# Patient Record
Sex: Female | Born: 2000 | Race: White | Hispanic: No | Marital: Single
Health system: Southern US, Community
[De-identification: ages and names within clinical notes are randomized; demographics above are authoritative.]

---
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
## 2018-02-08 ENCOUNTER — Emergency Department
Admission: EM | Admit: 2018-02-08 | Discharge: 2018-02-08 | Disposition: A | Discharge status: Home / Self Care | Payer: Managed Care, Other (non HMO) | Attending: Emergency Medicine | Admitting: Emergency Medicine

## 2018-02-08 ENCOUNTER — Other Ambulatory Visit: Payer: Self-pay

## 2018-02-08 ENCOUNTER — Encounter: Payer: Self-pay | Admitting: Emergency Medicine

## 2018-02-08 ENCOUNTER — Emergency Department: Payer: Managed Care, Other (non HMO)

## 2018-02-08 DIAGNOSIS — J181 Lobar pneumonia, unspecified organism: Secondary | ICD-10-CM | POA: Insufficient documentation

## 2018-02-08 DIAGNOSIS — R509 Fever, unspecified: Secondary | ICD-10-CM | POA: Diagnosis present

## 2018-02-08 DIAGNOSIS — J189 Pneumonia, unspecified organism: Secondary | ICD-10-CM

## 2018-02-08 LAB — CBC WITH DIFFERENTIAL/PLATELET
Abs Immature Granulocytes: 0.03 10*3/uL (ref 0.00–0.07)
BASOS ABS: 0 10*3/uL (ref 0.0–0.1)
BASOS PCT: 0 %
EOS ABS: 0 10*3/uL (ref 0.0–0.5)
EOS PCT: 0 %
HCT: 34.6 % — ABNORMAL LOW (ref 36.0–46.0)
Hemoglobin: 11.1 g/dL — ABNORMAL LOW (ref 12.0–15.0)
IMMATURE GRANULOCYTES: 1 %
Lymphocytes Relative: 11 %
Lymphs Abs: 0.6 10*3/uL — ABNORMAL LOW (ref 0.7–4.0)
MCH: 25.5 pg — ABNORMAL LOW (ref 26.0–34.0)
MCHC: 32.1 g/dL (ref 30.0–36.0)
MCV: 79.5 fL — ABNORMAL LOW (ref 80.0–100.0)
Monocytes Absolute: 0.3 10*3/uL (ref 0.1–1.0)
Monocytes Relative: 5 %
NEUTROS PCT: 83 %
NRBC: 0 % (ref 0.0–0.2)
Neutro Abs: 5.1 10*3/uL (ref 1.7–7.7)
PLATELETS: 205 10*3/uL (ref 150–400)
RBC: 4.35 MIL/uL (ref 3.87–5.11)
RDW: 13.9 % (ref 11.5–15.5)
WBC: 6.1 10*3/uL (ref 4.0–10.5)

## 2018-02-08 LAB — COMPREHENSIVE METABOLIC PANEL
ALBUMIN: 3.1 g/dL — AB (ref 3.5–5.0)
ALT: 8 U/L (ref 0–44)
ANION GAP: 11 (ref 5–15)
AST: 23 U/L (ref 15–41)
Alkaline Phosphatase: 99 U/L (ref 38–126)
BUN: 7 mg/dL (ref 6–20)
CALCIUM: 8.4 mg/dL — AB (ref 8.9–10.3)
CHLORIDE: 105 mmol/L (ref 98–111)
CO2: 20 mmol/L — ABNORMAL LOW (ref 22–32)
CREATININE: 0.65 mg/dL (ref 0.44–1.00)
Glucose, Bld: 96 mg/dL (ref 70–99)
Potassium: 4 mmol/L (ref 3.5–5.1)
Sodium: 136 mmol/L (ref 135–145)
Total Bilirubin: 1.1 mg/dL (ref 0.3–1.2)
Total Protein: 7.4 g/dL (ref 6.5–8.1)

## 2018-02-08 LAB — HCG, QUANTITATIVE, PREGNANCY

## 2018-02-08 MED ORDER — SODIUM CHLORIDE 0.9 % IV BOLUS
1000.0000 mL | Freq: Once | INTRAVENOUS | Status: AC
  Administered 2018-02-08: 1000 mL via INTRAVENOUS

## 2018-02-08 MED ORDER — ONDANSETRON 4 MG PO TBDP: 4.0000 mg | ORAL_TABLET | Freq: Three times a day (TID) | ORAL | 0 refills | Status: AC | PRN

## 2018-02-08 MED ORDER — ONDANSETRON HCL 4 MG/2ML IJ SOLN
4.0000 mg | Freq: Once | INTRAMUSCULAR | Status: AC
  Administered 2018-02-08: 4 mg via INTRAVENOUS

## 2018-02-08 MED ORDER — ACETAMINOPHEN 500 MG PO TABS
1000.0000 mg | ORAL_TABLET | Freq: Once | ORAL | Status: AC
  Administered 2018-02-08: 1000 mg via ORAL
  Filled 2018-02-08: qty 2

## 2018-02-08 MED ORDER — LEVOFLOXACIN 750 MG PO TABS: 750.0000 mg | ORAL_TABLET | Freq: Every day | ORAL | 0 refills | Status: AC

## 2018-02-08 MED ORDER — IBUPROFEN 600 MG PO TABS
600.0000 mg | ORAL_TABLET | Freq: Once | ORAL | Status: AC
  Administered 2018-02-08: 600 mg via ORAL
  Filled 2018-02-08: qty 1

## 2018-02-08 MED ORDER — LEVOFLOXACIN IN D5W 750 MG/150ML IV SOLN
750.0000 mg | Freq: Once | INTRAVENOUS | Status: AC
  Administered 2018-02-08: 750 mg via INTRAVENOUS
  Filled 2018-02-08: qty 150

## 2018-02-08 MED ORDER — ONDANSETRON HCL 4 MG/2ML IJ SOLN
INTRAMUSCULAR | Status: AC
  Administered 2018-02-08: 4 mg via INTRAVENOUS
  Filled 2018-02-08: qty 4

## 2018-02-08 NOTE — Discharge Instructions (Addendum)
Please stop taking your doxycycline and switch to Levaquin instead.  Take Levaquin once a day for the next 6 days to complete a 7-day course.  Return to the emergency department sooner for any concerns.  It was a pleasure to take care of you today, and thank you for coming to our emergency department.  If you have any questions or concerns before leaving please ask the nurse to grab me and I'm more than happy to go through your aftercare instructions again.  If you were prescribed any opioid pain medication today such as Norco, Vicodin, Percocet, morphine, hydrocodone, or oxycodone please make sure you do not drive when you are taking this medication as it can alter your ability to drive safely.  If you have any concerns once you are home that you are not improving or are in fact getting worse before you can make it to your follow-up appointment, please do not hesitate to call [phone] and come back for further evaluation.  Merrily BrittleNeil Michalene Debruler, MD  Results for orders placed or performed during the hospital encounter of 02/08/18  Comprehensive metabolic panel  Result Value Ref Range   Sodium 136 135 - 145 mmol/L   Potassium 4.0 3.5 - 5.1 mmol/L   Chloride 105 98 - 111 mmol/L   CO2 20 (L) 22 - 32 mmol/L   Glucose, Bld 96 70 - 99 mg/dL   BUN 7 6 - 20 mg/dL   Creatinine, Ser 1.190.65 0.44 - 1.00 mg/dL   Calcium 8.4 (L) 8.9 - 10.3 mg/dL   Total Protein 7.4 6.5 - 8.1 g/dL   Albumin 3.1 (L) 3.5 - 5.0 g/dL   AST 23 15 - 41 U/L   ALT 8 0 - 44 U/L   Alkaline Phosphatase 99 38 - 126 U/L   Total Bilirubin 1.1 0.3 - 1.2 mg/dL   GFR calc non Af Amer >60 >60 mL/min   GFR calc Af Amer >60 >60 mL/min   Anion gap 11 5 - 15  CBC with Differential  Result Value Ref Range   WBC 6.1 4.0 - 10.5 K/uL   RBC 4.35 3.87 - 5.11 MIL/uL   Hemoglobin 11.1 (L) 12.0 - 15.0 g/dL   HCT 14.734.6 (L) 82.936.0 - 56.246.0 %   MCV 79.5 (L) 80.0 - 100.0 fL   MCH 25.5 (L) 26.0 - 34.0 pg   MCHC 32.1 30.0 - 36.0 g/dL   RDW 13.013.9 86.511.5 - 78.415.5 %   Platelets 205 150 - 400 K/uL   nRBC 0.0 0.0 - 0.2 %   Neutrophils Relative % PENDING %   Neutro Abs PENDING 1.7 - 7.7 K/uL   Band Neutrophils PENDING %   Lymphocytes Relative PENDING %   Lymphs Abs PENDING 0.7 - 4.0 K/uL   Monocytes Relative PENDING %   Monocytes Absolute PENDING 0.1 - 1.0 K/uL   Eosinophils Relative PENDING %   Eosinophils Absolute PENDING 0.0 - 0.5 K/uL   Basophils Relative PENDING %   Basophils Absolute PENDING 0.0 - 0.1 K/uL   WBC Morphology PENDING    RBC Morphology PENDING    Smear Review PENDING    Other PENDING %   nRBC PENDING 0 /100 WBC   Metamyelocytes Relative PENDING %   Myelocytes PENDING %   Promyelocytes Relative PENDING %   Blasts PENDING %   Dg Chest 2 View  Result Date: 02/08/2018 CLINICAL DATA:  Shortness of breath EXAM: CHEST - 2 VIEW COMPARISON:  None. FINDINGS: Left lower lobe pneumonia. No pleural  effusion. Normal heart size. No pneumothorax. IMPRESSION: Left lower lobe pneumonia Electronically Signed   By: Jasmine Pang M.D.   On: 02/08/2018 02:08

## 2018-02-08 NOTE — ED Provider Notes (Signed)
[facility] Emergency Department Provider Note  ____________________________________________   First MD Initiated Contact with Patient 02/08/18 0154     (approximate)  I have reviewed the triage vital signs and the nursing notes.   HISTORY  Chief Complaint Fever   HPI Rebekah Nicholson is a 18 y.o. female who comes to the emergency department with fever cough and shortness of breath for the past several days.  She said she was diagnosed with both influenza and pneumonia this past Friday and was prescribed oxacillin.  She has been taking the medications although feels her symptoms have not improved.  They are currently worse with exertion and somewhat improved with rest.  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  She does have several sick contacts.    History reviewed. No pertinent past medical history.  There are no active problems to display for this patient.   History reviewed. No pertinent surgical history.  Prior to Admission medications   Medication Sig Start Date End Date Taking? Authorizing Provider  levofloxacin (LEVAQUIN) 750 MG tablet Take 1 tablet (750 mg total) by mouth daily for 6 days. 02/08/18 02/14/18  Merrily Brittle, MD  ondansetron (ZOFRAN ODT) 4 MG disintegrating tablet Take 1 tablet (4 mg total) by mouth every 8 (eight) hours as needed for nausea or vomiting. 02/08/18   Merrily Brittle, MD    Allergies Zithromax [azithromycin]  No family history on file.  Social History Social History   Tobacco Use  . Smoking status: Not on file  Substance Use Topics  . Alcohol use: Not on file  . Drug use: Not on file    Review of Systems Constitutional: Positive for fevers and chills Eyes: No visual changes. ENT: No sore throat. Cardiovascular: Positive for chest pain. Respiratory: Positive for shortness of breath. Gastrointestinal: No abdominal pain.  No nausea, no vomiting.  No diarrhea.  No constipation. Genitourinary: Negative for  dysuria. Musculoskeletal: Negative for back pain. Skin: Negative for rash. Neurological: Negative for headaches, focal weakness or numbness.   ____________________________________________   PHYSICAL EXAM:  VITAL SIGNS: ED Triage Vitals [02/08/18 0154]  Enc Vitals Group     BP      Pulse      Resp      Temp      Temp src      SpO2      Weight 126 lb (57.2 kg)     Height 5\' 5"  (1.651 m)     Head Circumference      Peak Flow      Pain Score      Pain Loc      Pain Edu?      Excl. in GC?     Constitutional: Alert and oriented x4 nontoxic no diaphoresis speaks in full clear sentences Eyes: PERRL EOMI. Head: Atraumatic. Nose: No congestion/rhinnorhea. Mouth/Throat: No trismus Neck: No stridor.   Cardiovascular: Tachycardic rate, regular rhythm. Grossly normal heart sounds.  Good peripheral circulation. Respiratory: Increased respiratory effort.  No retractions.  Rales at left base Gastrointestinal: Soft nontender Musculoskeletal: No lower extremity edema   Neurologic:  Normal speech and language. No gross focal neurologic deficits are appreciated. Skin:  Skin is warm, dry and intact. No rash noted. Psychiatric: Mood and affect are normal. Speech and behavior are normal.    ____________________________________________   DIFFERENTIAL includes but not limited to  Pneumonia, influenza, viral syndrome, empyema ____________________________________________   LABS (all labs ordered are listed, but only abnormal results are displayed)  Labs Reviewed  COMPREHENSIVE METABOLIC PANEL - Abnormal; Notable for the following components:      Result Value   CO2 20 (*)    Calcium 8.4 (*)    Albumin 3.1 (*)    All other components within normal limits  CBC WITH DIFFERENTIAL/PLATELET - Abnormal; Notable for the following components:   Hemoglobin 11.1 (*)    HCT 34.6 (*)    MCV 79.5 (*)    MCH 25.5 (*)    Lymphs Abs 0.6 (*)    All other components within normal limits  HCG,  QUANTITATIVE, PREGNANCY    Lab work reviewed by me with slightly elevated white count which is nonspecific and could be secondary to infection.  Slightly low CO2 consistent with acidemia __________________________________________  EKG   ____________________________________________  RADIOLOGY  This x-ray reviewed by me shows left lower lobe pneumonia ____________________________________________   PROCEDURES  Procedure(s) performed: no  Procedures  Critical Care performed: no  ____________________________________________   INITIAL IMPRESSION / ASSESSMENT AND PLAN / ED COURSE  Pertinent labs & imaging results that were available during my care of the patient were reviewed by me and considered in my medical decision making (see chart for details).   As part of my medical decision making, I reviewed the following data within the electronic MEDICAL RECORD NUMBER History obtained from family if available, nursing notes, old chart and ekg, as well as notes from prior ED visits.  The patient comes to the emergency department with persistent fever cough and malaise.  She is initially somewhat tachycardic although this came down with fluids.  I obtained a chest x-ray which did confirm left lower lobe pneumonia.  She has failed doxycycline at this point which is not surprising as it does not have great strep pneumo coverage.  I would normally treat her with azithromycin and amoxicillin for strep pneumo and atypical coverage however as she is anaphylactic to azithromycin I do think she requires Levaquin.  First dose given here and 6 days for home.  Strict return precautions have been given.  Her heart rate normalized prior to discharge.      ____________________________________________   FINAL CLINICAL IMPRESSION(S) / ED DIAGNOSES  Final diagnoses:  Community acquired pneumonia of left lower lobe of lung (HCC)      NEW MEDICATIONS STARTED DURING THIS VISIT:  Discharge Medication  List as of 02/08/2018  3:16 AM    START taking these medications   Details  levofloxacin (LEVAQUIN) 750 MG tablet Take 1 tablet (750 mg total) by mouth daily for 6 days., Starting Thu 02/08/2018, Until Wed 02/14/2018, Print    ondansetron (ZOFRAN ODT) 4 MG disintegrating tablet Take 1 tablet (4 mg total) by mouth every 8 (eight) hours as needed for nausea or vomiting., Starting Thu 02/08/2018, Print         Note:  This document was prepared using Dragon voice recognition software and may include unintentional dictation errors.    Merrily Brittleifenbark, Aldric Wenzler, MD 02/11/18 [phone]

## 2018-02-08 NOTE — ED Triage Notes (Signed)
Patient ambulatory to triage with steady gait, without difficulty or distress noted; pt reports dx with influenza and pneumonia on Friday; currently taking doxycycline; c/o persistent fever with N/V and prod cough green sputum

## 2019-08-16 IMAGING — CR DG CHEST 2V
2 series · 2 of 2 positions shown · non-contrast
Comparison: None.

CLINICAL DATA: Shortness of breath

EXAM:
CHEST - 2 VIEW

[chest pa]
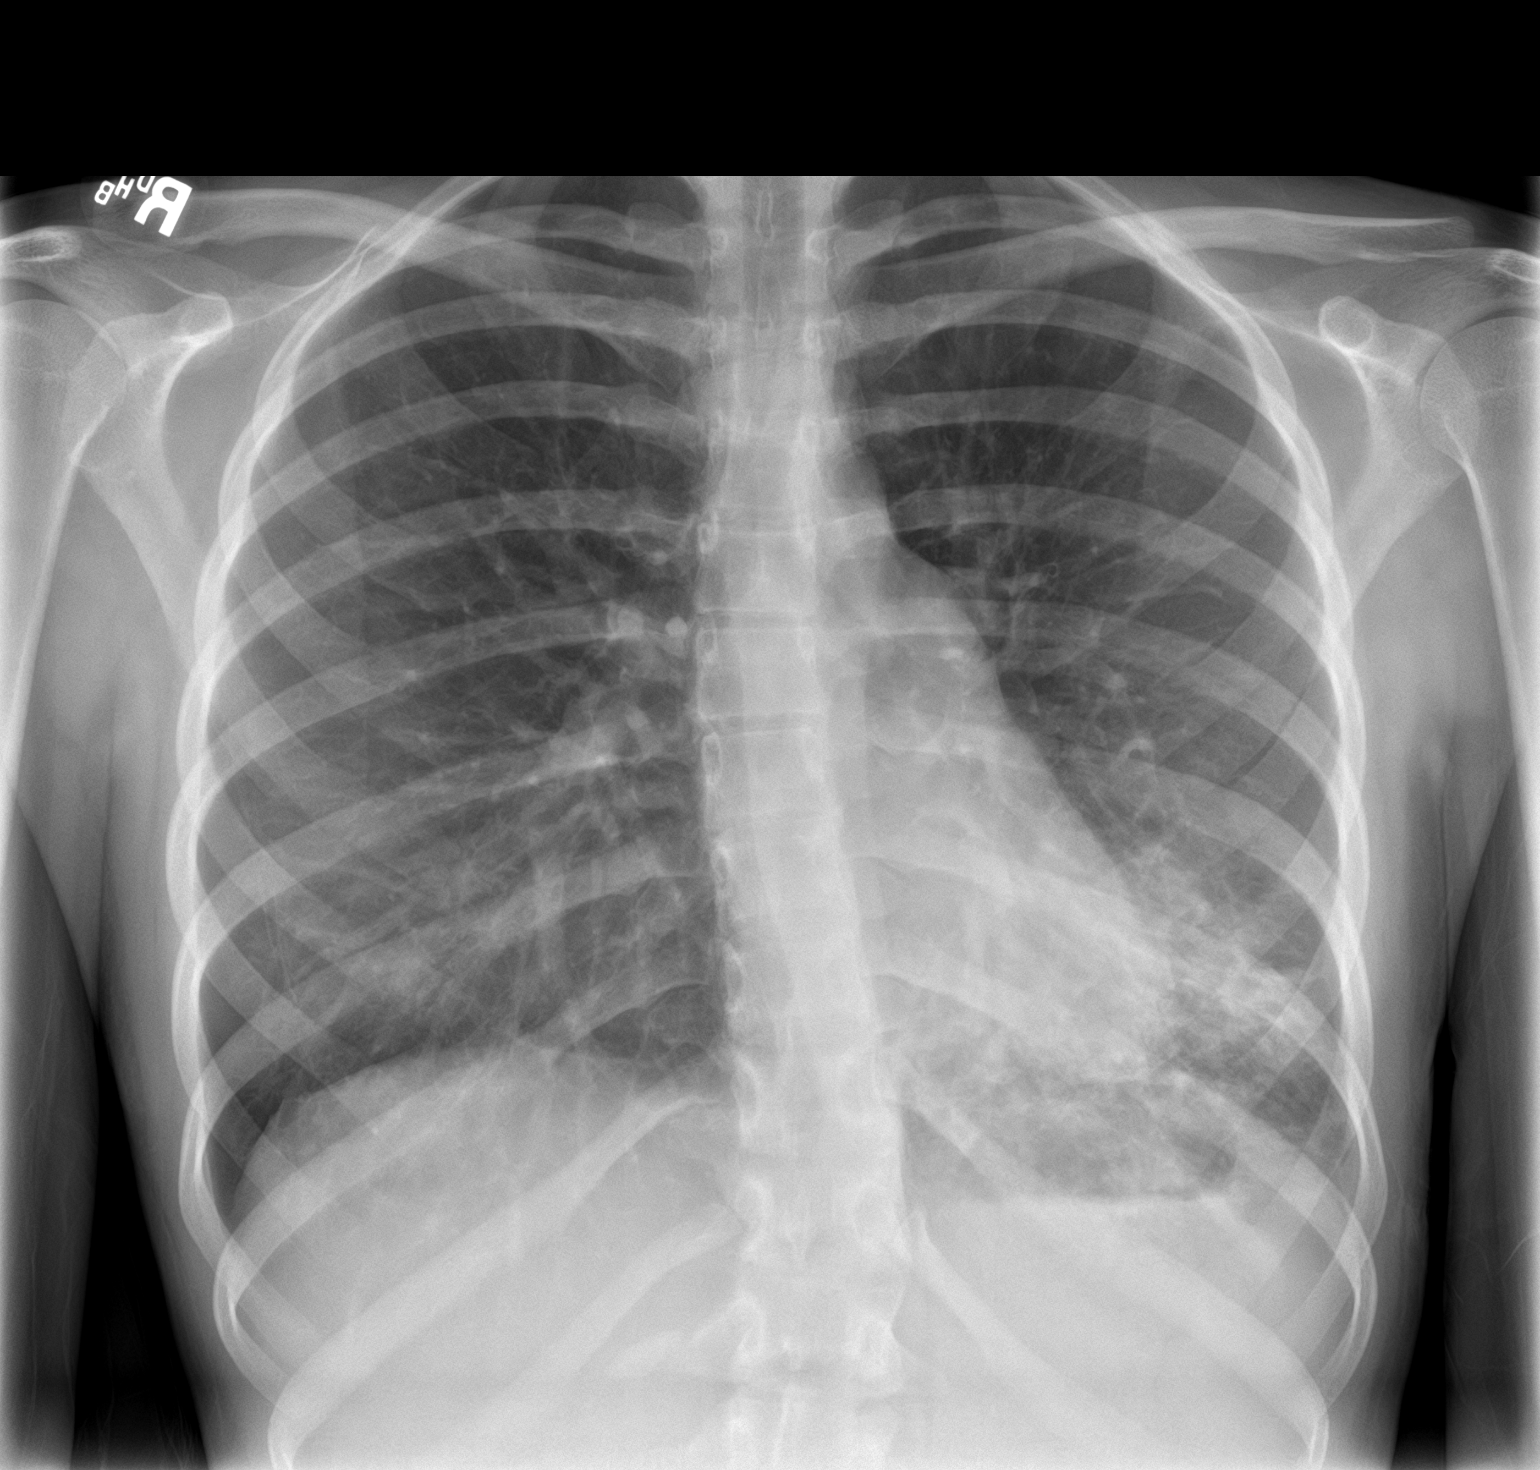

[chest lat]
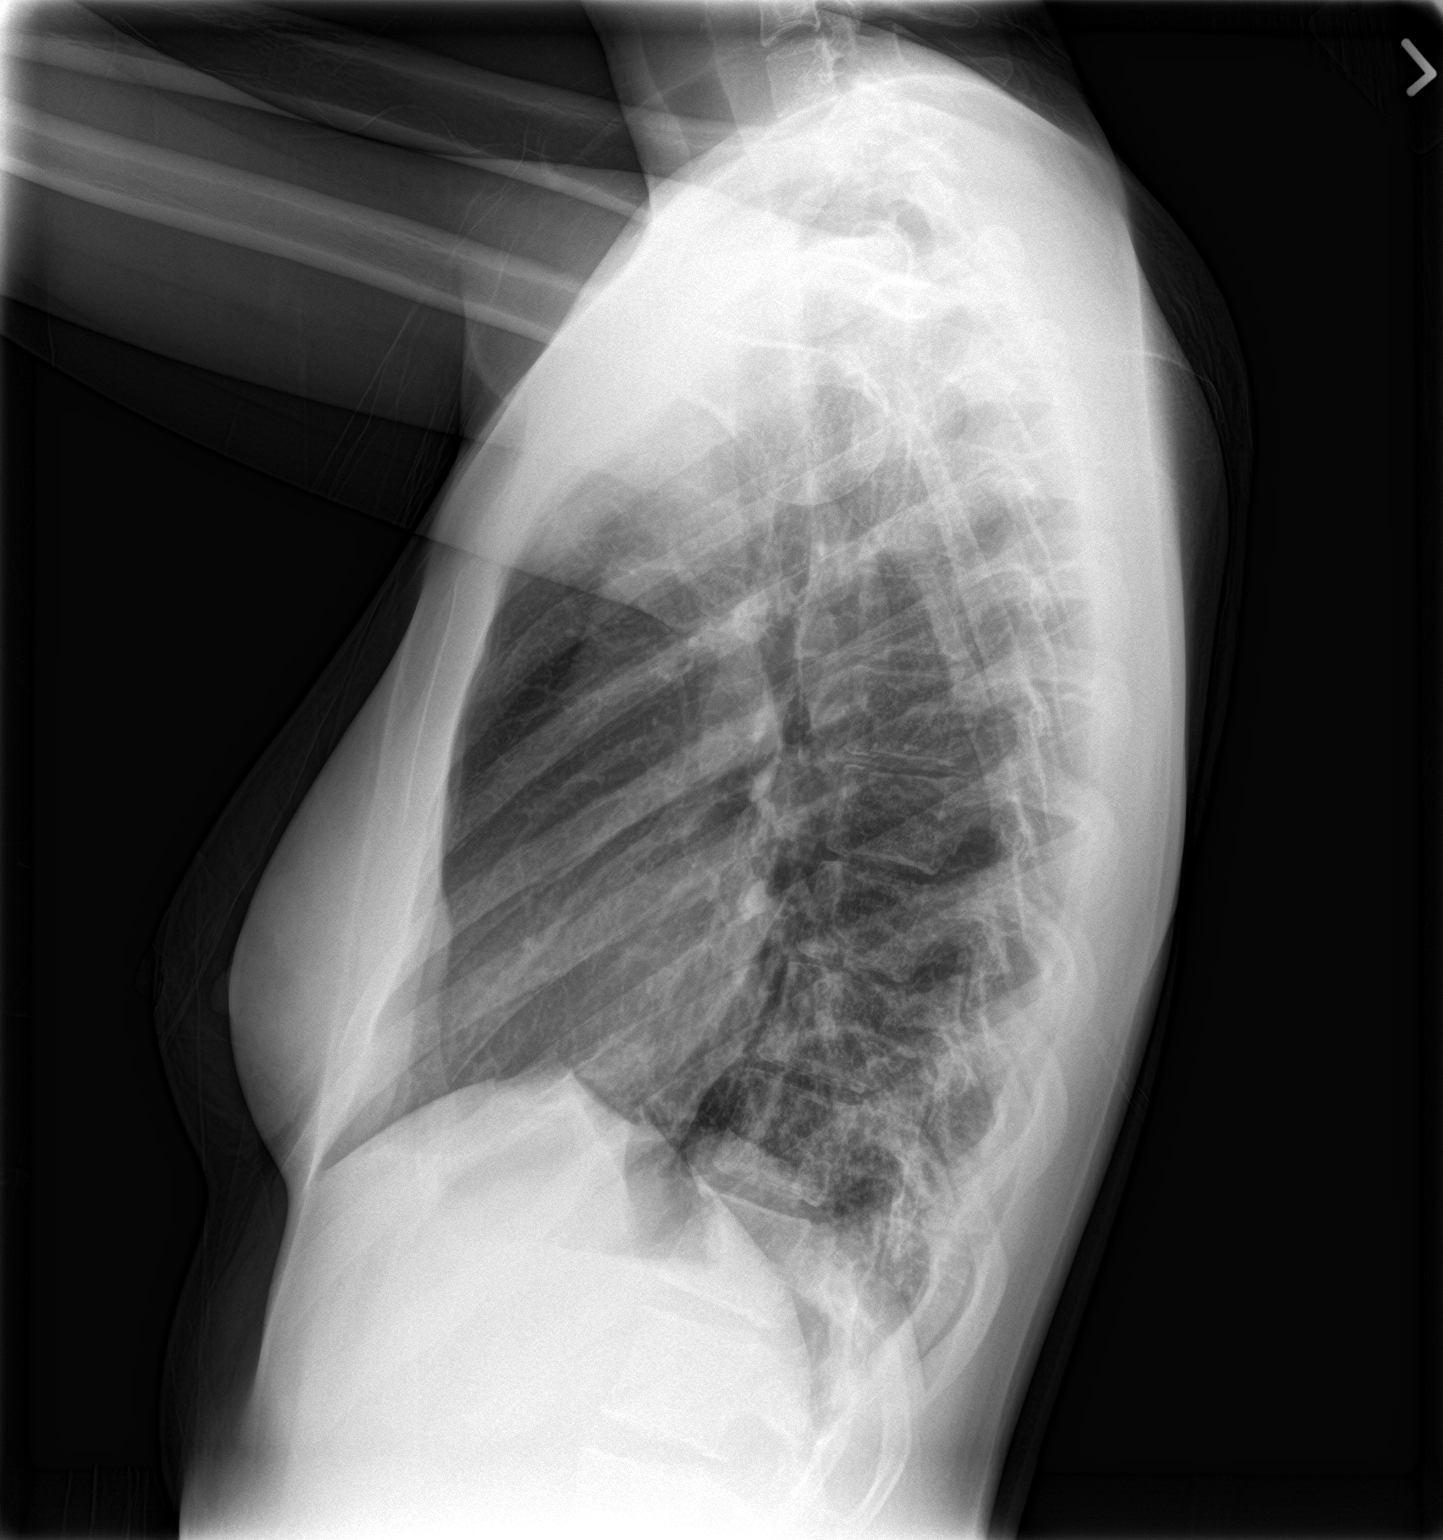

[2 of 2 positions shown; findings below may reference images not displayed]

FINDINGS: Left lower lobe pneumonia. No pleural effusion. Normal heart size.
No pneumothorax.
IMPRESSION: Left lower lobe pneumonia
